# Patient Record
Sex: Male | Born: 1985 | Race: White | Hispanic: No | Marital: Single | Smoking: Current every day smoker
Health system: Southern US, Community
[De-identification: ages and names within clinical notes are randomized; demographics above are authoritative.]

## PROBLEM LIST (undated history)

## (undated) DIAGNOSIS — I10 Essential (primary) hypertension: Secondary | ICD-10-CM

---

## 2014-08-20 ENCOUNTER — Emergency Department (HOSPITAL_COMMUNITY): Payer: No Typology Code available for payment source

## 2014-08-20 ENCOUNTER — Emergency Department (HOSPITAL_COMMUNITY)
Admission: EM | Admit: 2014-08-20 | Discharge: 2014-08-21 | Disposition: A | Discharge status: Home / Self Care | Payer: No Typology Code available for payment source | Attending: Emergency Medicine | Admitting: Emergency Medicine

## 2014-08-20 ENCOUNTER — Encounter (HOSPITAL_COMMUNITY): Payer: Self-pay | Admitting: Emergency Medicine

## 2014-08-20 DIAGNOSIS — Y998 Other external cause status: Secondary | ICD-10-CM | POA: Insufficient documentation

## 2014-08-20 DIAGNOSIS — Y9389 Activity, other specified: Secondary | ICD-10-CM | POA: Diagnosis not present

## 2014-08-20 DIAGNOSIS — I10 Essential (primary) hypertension: Secondary | ICD-10-CM | POA: Insufficient documentation

## 2014-08-20 DIAGNOSIS — S301XXA Contusion of abdominal wall, initial encounter: Secondary | ICD-10-CM | POA: Diagnosis not present

## 2014-08-20 DIAGNOSIS — Z720 Tobacco use: Secondary | ICD-10-CM | POA: Diagnosis not present

## 2014-08-20 DIAGNOSIS — S3992XA Unspecified injury of lower back, initial encounter: Secondary | ICD-10-CM | POA: Diagnosis present

## 2014-08-20 DIAGNOSIS — S0012XA Contusion of left eyelid and periocular area, initial encounter: Secondary | ICD-10-CM | POA: Diagnosis not present

## 2014-08-20 DIAGNOSIS — Y9289 Other specified places as the place of occurrence of the external cause: Secondary | ICD-10-CM | POA: Diagnosis not present

## 2014-08-20 DIAGNOSIS — H05232 Hemorrhage of left orbit: Secondary | ICD-10-CM

## 2014-08-20 DIAGNOSIS — S4991XA Unspecified injury of right shoulder and upper arm, initial encounter: Secondary | ICD-10-CM | POA: Insufficient documentation

## 2014-08-20 HISTORY — DX: Essential (primary) hypertension: I10

## 2014-08-20 LAB — URINALYSIS, ROUTINE W REFLEX MICROSCOPIC
Bilirubin Urine: NEGATIVE
Glucose, UA: NEGATIVE mg/dL
Hgb urine dipstick: NEGATIVE
Ketones, ur: NEGATIVE mg/dL
LEUKOCYTES UA: NEGATIVE
Nitrite: NEGATIVE
PH: 5 (ref 5.0–8.0)
Protein, ur: NEGATIVE mg/dL
SPECIFIC GRAVITY, URINE: 1.045 — AB (ref 1.005–1.030)
Urobilinogen, UA: 0.2 mg/dL (ref 0.0–1.0)

## 2014-08-20 LAB — I-STAT CREATININE, ED: Creatinine, Ser: 1.2 mg/dL (ref 0.61–1.24)

## 2014-08-20 MED ORDER — OXYCODONE-ACETAMINOPHEN 5-325 MG PO TABS
2.0000 | ORAL_TABLET | Freq: Once | ORAL | Status: AC
  Administered 2014-08-20: 2 via ORAL
  Filled 2014-08-20: qty 2

## 2014-08-20 MED ORDER — SODIUM CHLORIDE 0.9 % IV BOLUS (SEPSIS)
1000.0000 mL | Freq: Once | INTRAVENOUS | Status: AC
  Administered 2014-08-20: 1000 mL via INTRAVENOUS

## 2014-08-20 MED ORDER — IBUPROFEN 800 MG PO TABS: 800.0000 mg | ORAL_TABLET | Freq: Three times a day (TID) | ORAL | Status: AC

## 2014-08-20 MED ORDER — TETANUS-DIPHTH-ACELL PERTUSSIS 5-2.5-18.5 LF-MCG/0.5 IM SUSP
0.5000 mL | Freq: Once | INTRAMUSCULAR | Status: AC
  Administered 2014-08-20: 0.5 mL via INTRAMUSCULAR
  Filled 2014-08-20: qty 0.5

## 2014-08-20 MED ORDER — MORPHINE SULFATE 4 MG/ML IJ SOLN
6.0000 mg | Freq: Once | INTRAMUSCULAR | Status: AC
Start: 2014-08-20 — End: 2014-08-20
  Administered 2014-08-20: 6 mg via INTRAVENOUS
  Filled 2014-08-20: qty 2

## 2014-08-20 MED ORDER — IOHEXOL 300 MG/ML  SOLN
100.0000 mL | Freq: Once | INTRAMUSCULAR | Status: AC | PRN
  Administered 2014-08-20: 100 mL via INTRAVENOUS

## 2014-08-20 MED ORDER — HYDROCODONE-ACETAMINOPHEN 5-325 MG PO TABS: 1.0000 | ORAL_TABLET | Freq: Four times a day (QID) | ORAL | Status: AC | PRN

## 2014-08-20 MED ORDER — ONDANSETRON HCL 4 MG/2ML IJ SOLN
4.0000 mg | Freq: Once | INTRAMUSCULAR | Status: AC
  Administered 2014-08-20: 4 mg via INTRAVENOUS
  Filled 2014-08-20: qty 2

## 2014-08-20 NOTE — ED Notes (Signed)
MD at bedside. 
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
## 2014-08-20 NOTE — ED Notes (Signed)
Pt leaving for CT.  

## 2014-08-20 NOTE — ED Notes (Signed)
Pt ambulatory in hallway with steady gait.
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
## 2015-07-12 ENCOUNTER — Encounter (HOSPITAL_COMMUNITY): Payer: Self-pay | Admitting: *Deleted

## 2015-07-12 ENCOUNTER — Emergency Department (HOSPITAL_COMMUNITY): Payer: No Typology Code available for payment source

## 2015-07-12 ENCOUNTER — Emergency Department (HOSPITAL_COMMUNITY)
Admission: EM | Admit: 2015-07-12 | Discharge: 2015-07-12 | Disposition: A | Discharge status: Home / Self Care | Payer: No Typology Code available for payment source | Attending: Emergency Medicine | Admitting: Emergency Medicine

## 2015-07-12 DIAGNOSIS — I10 Essential (primary) hypertension: Secondary | ICD-10-CM | POA: Insufficient documentation

## 2015-07-12 DIAGNOSIS — Y999 Unspecified external cause status: Secondary | ICD-10-CM | POA: Insufficient documentation

## 2015-07-12 DIAGNOSIS — Y92009 Unspecified place in unspecified non-institutional (private) residence as the place of occurrence of the external cause: Secondary | ICD-10-CM | POA: Insufficient documentation

## 2015-07-12 DIAGNOSIS — Y9389 Activity, other specified: Secondary | ICD-10-CM | POA: Insufficient documentation

## 2015-07-12 DIAGNOSIS — W294XXA Contact with nail gun, initial encounter: Secondary | ICD-10-CM | POA: Insufficient documentation

## 2015-07-12 DIAGNOSIS — Z79899 Other long term (current) drug therapy: Secondary | ICD-10-CM | POA: Insufficient documentation

## 2015-07-12 DIAGNOSIS — F17210 Nicotine dependence, cigarettes, uncomplicated: Secondary | ICD-10-CM | POA: Insufficient documentation

## 2015-07-12 DIAGNOSIS — S6992XA Unspecified injury of left wrist, hand and finger(s), initial encounter: Secondary | ICD-10-CM

## 2015-07-12 DIAGNOSIS — S61442A Puncture wound with foreign body of left hand, initial encounter: Secondary | ICD-10-CM | POA: Insufficient documentation

## 2015-07-12 MED ORDER — HYDROMORPHONE HCL 1 MG/ML IJ SOLN
1.0000 mg | Freq: Once | INTRAMUSCULAR | Status: AC
  Administered 2015-07-12: 1 mg via INTRAVENOUS
  Filled 2015-07-12: qty 1

## 2015-07-12 MED ORDER — LIDOCAINE HCL 2 % IJ SOLN
10.0000 mL | Freq: Once | INTRAMUSCULAR | Status: AC
  Administered 2015-07-12: 200 mg
  Filled 2015-07-12: qty 20

## 2015-07-12 MED ORDER — CEPHALEXIN 500 MG PO CAPS: 500.0000 mg | ORAL_CAPSULE | Freq: Four times a day (QID) | ORAL | Status: AC

## 2015-07-12 MED ORDER — ONDANSETRON HCL 4 MG/2ML IJ SOLN
4.0000 mg | Freq: Once | INTRAMUSCULAR | Status: AC
  Administered 2015-07-12: 4 mg via INTRAVENOUS
  Filled 2015-07-12: qty 2

## 2015-07-12 MED ORDER — OXYCODONE-ACETAMINOPHEN 5-325 MG PO TABS: 1.0000 | ORAL_TABLET | Freq: Four times a day (QID) | ORAL | Status: AC | PRN
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
## 2015-07-12 NOTE — Discharge Instructions (Signed)
Watch for signs of infection. Keep the wound clean continued antibiotics. Follow-up Dr. Melvyn Novasrtmann in 1 week

## 2015-07-12 NOTE — ED Provider Notes (Signed)
CSN: [id number]     Arrival date & time 07/12/15  1845 History   First MD Initiated Contact with Patient 07/12/15 1858     Chief Complaint  Patient presents with  . Puncture Wound      The history is provided by the patient.  Patient was working framing a house today when he is holding up a 2 x 4 this left hand. Began to fall and the nail then fired a framing nail into his left hand. No other injury. Slight numbness in the right thumb. Nail enters on the palmar aspect and is sticking out on the dorsal aspect of the hand. Tetanus is up-to-date. He is otherwise healthy but does smoke cigarettes.   Past Medical History  Diagnosis Date  . Hypertension    History reviewed. No pertinent past surgical history. No family history on file. Social History  Substance Use Topics  . Smoking status: Current Every Day Smoker -- 1.00 packs/day    Types: Cigarettes  . Smokeless tobacco: None  . Alcohol Use: Yes     Comment: occ    Review of Systems  Constitutional: Negative for fever and appetite change.  Respiratory: Negative for shortness of breath.   Cardiovascular: Negative for chest pain.  Musculoskeletal: Negative for myalgias and joint swelling.  Skin: Positive for wound.  Neurological: Positive for numbness.  Hematological: Negative for adenopathy. Does not bruise/bleed easily.      Allergies  Review of patient's allergies indicates no known allergies.  Home Medications   Prior to Admission medications   Medication Sig Start Date End Date Taking? Authorizing Provider  amLODipine (NORVASC) 10 MG tablet Take 10 mg by mouth daily.   Yes Historical Provider, MD  cyclobenzaprine (FLEXERIL) 5 MG tablet Take 5 mg by mouth at bedtime.   Yes Historical Provider, MD  cephALEXin (KEFLEX) 500 MG capsule Take 1 capsule (500 mg total) by mouth 4 (four) times daily. 07/12/15   Benjiman CoreNathan Deklan Minar, MD  HYDROcodone-acetaminophen (NORCO/VICODIN) 5-325 MG per tablet Take 1 tablet by mouth every 6  (six) hours as needed for moderate pain. Patient not taking: Reported on 07/12/2015 08/20/14   Fayrene HelperBowie Tran, PA-C  ibuprofen (ADVIL,MOTRIN) 800 MG tablet Take 1 tablet (800 mg total) by mouth 3 (three) times daily. Patient not taking: Reported on 07/12/2015 08/20/14   Fayrene HelperBowie Tran, PA-C  oxyCODONE-acetaminophen (PERCOCET/ROXICET) 5-325 MG tablet Take 1-2 tablets by mouth every 6 (six) hours as needed for severe pain. 07/12/15   Benjiman CoreNathan Sianni Cloninger, MD   BP 166/99 mmHg  Pulse 93  Temp(Src) 98.6 F (37 C) (Oral)  Resp 20  SpO2 100% Physical Exam  Constitutional: He appears well-developed.  HENT:  Head: Atraumatic.  Neck: Neck supple.  Cardiovascular: Normal rate.   Pulmonary/Chest: Effort normal.  Abdominal: There is no tenderness.  Musculoskeletal: He exhibits tenderness.  There is a nail in his left hand. It enters in the thenar area near the base of the first metacarpal. Goes dorsally to the back of the hand more distally. Sensation grossly intact over the thumb but states he does feel little tingly. Good capillary refill.  Skin: Skin is warm.        ED Course  .Foreign Body Removal Date/Time: 07/12/2015 8:00 PM Performed by: Benjiman CorePICKERING, Franceska Strahm Authorized by: Benjiman CorePICKERING, Toan Mort Consent: Verbal consent obtained. Written consent not obtained. Body area: skin General location: upper extremity Location details: left hand Anesthesia: local infiltration and nerve block Local anesthetic: lidocaine 2% without epinephrine Anesthetic total: 10 (10 ml at radial  and medial  nerve and locally infiltrated) ml Patient sedated: no Patient restrained: no Patient cooperative: yes Localization method: visualized Removal mechanism: vice grips. Dressing: dressing applied Tendon involvement: none Depth: deep Complexity: simple 1 objects recovered. Objects recovered: nail Post-procedure assessment: foreign body removed Patient tolerance: Patient tolerated the procedure well with no immediate  complications   (including critical care time) Labs Review Labs Reviewed - No data to display  Imaging Review Dg Hand Complete Left  07/12/2015  CLINICAL DATA:  Nail injury to the left hand EXAM: LEFT HAND - COMPLETE 3+ VIEW COMPARISON:  None. FINDINGS: Approximately 5 cm in length metallic nail traverses the thenar soft tissues between the proximal first and second metacarpals, with no fracture, dislocation or suspicious focal osseous lesion. No appreciable degenerative or erosive arthropathy. Separate punctate metallic foreign body in the volar left hand soft tissues at the level of the distal left third metacarpal. IMPRESSION: Metallic nail traverses the thenar soft tissues between the proximal first and second metacarpals, without appreciable bone injury. Separate punctate metallic foreign body in the volar left hand soft tissues at the level of the distal left third metacarpal. Electronically Signed   By: Delbert PhenixJason A Poff M.D.   On: 07/12/2015 19:56   I have personally reviewed and evaluated these images and lab results as part of my medical decision-making.   EKG Interpretation None      MDM   Final diagnoses:  Injury of left hand by nail gun, initial encounter    Patient with framing nail in hand. Removed. Will give antibiotics. Follow-up with hand surgery as needed.     Benjiman CoreNathan Lynford Espinoza, MD 07/13/15 [phone]
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
## 2017-03-18 IMAGING — CR DG HAND COMPLETE 3+V*L*
3 series · 3 of 3 positions shown · non-contrast
Comparison: None.

CLINICAL DATA: Nail injury to the left hand

EXAM:
LEFT HAND - COMPLETE 3+ VIEW

[x hand pa left]
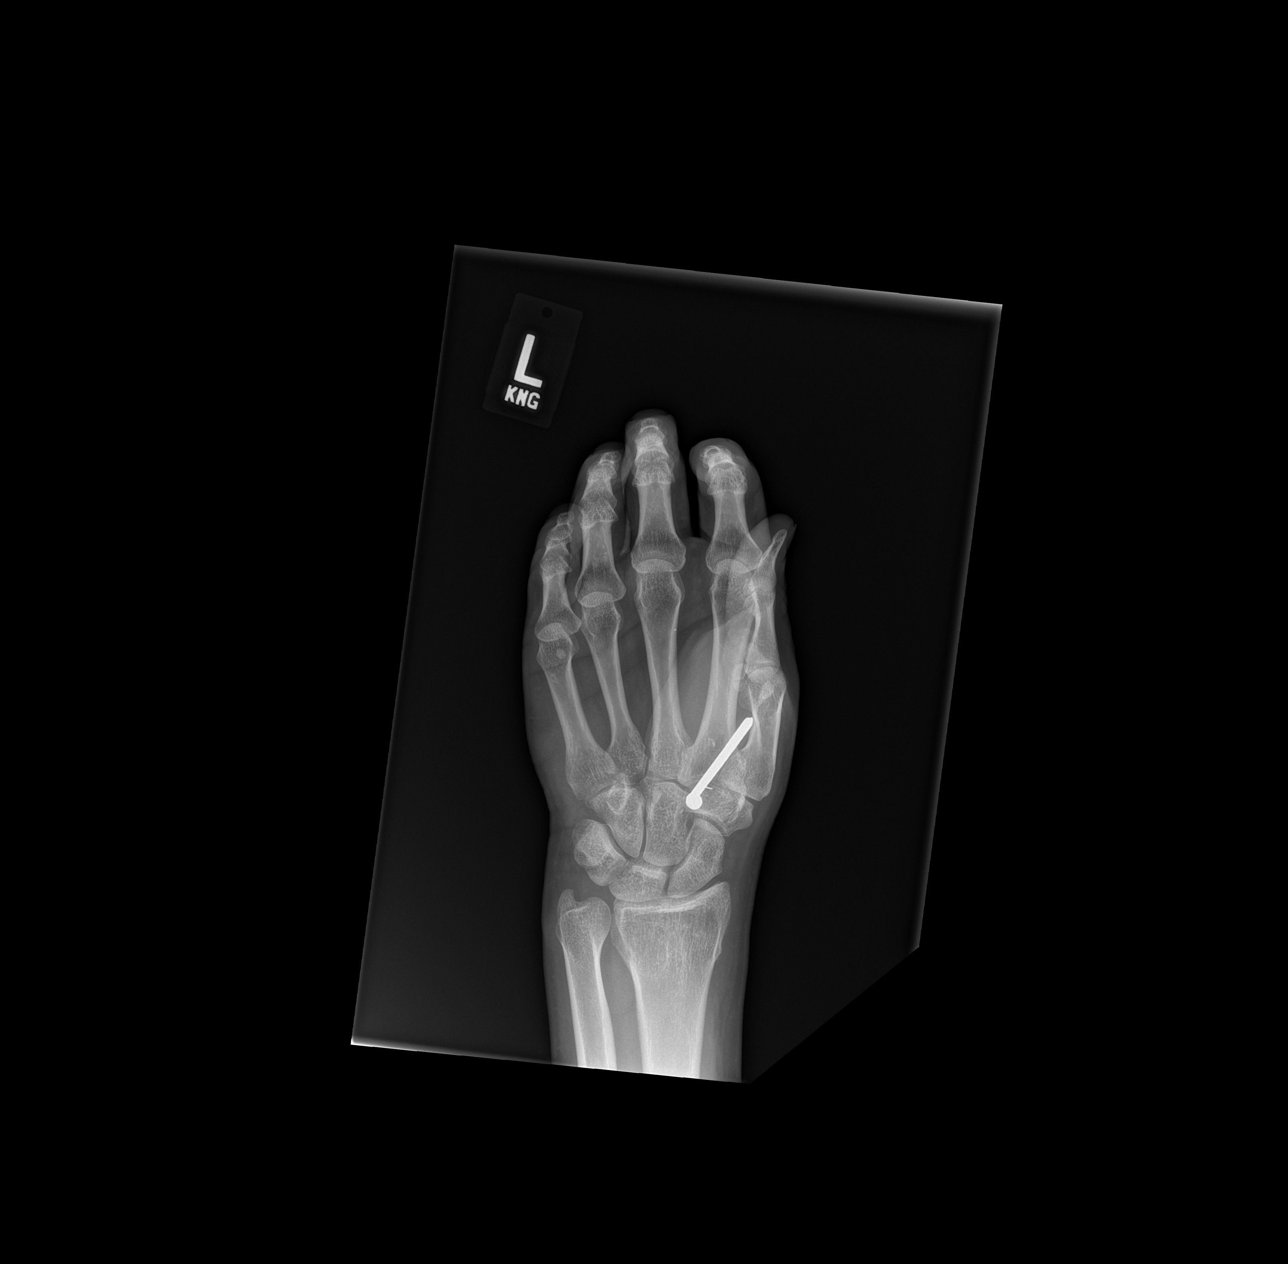

[x hand obl left]
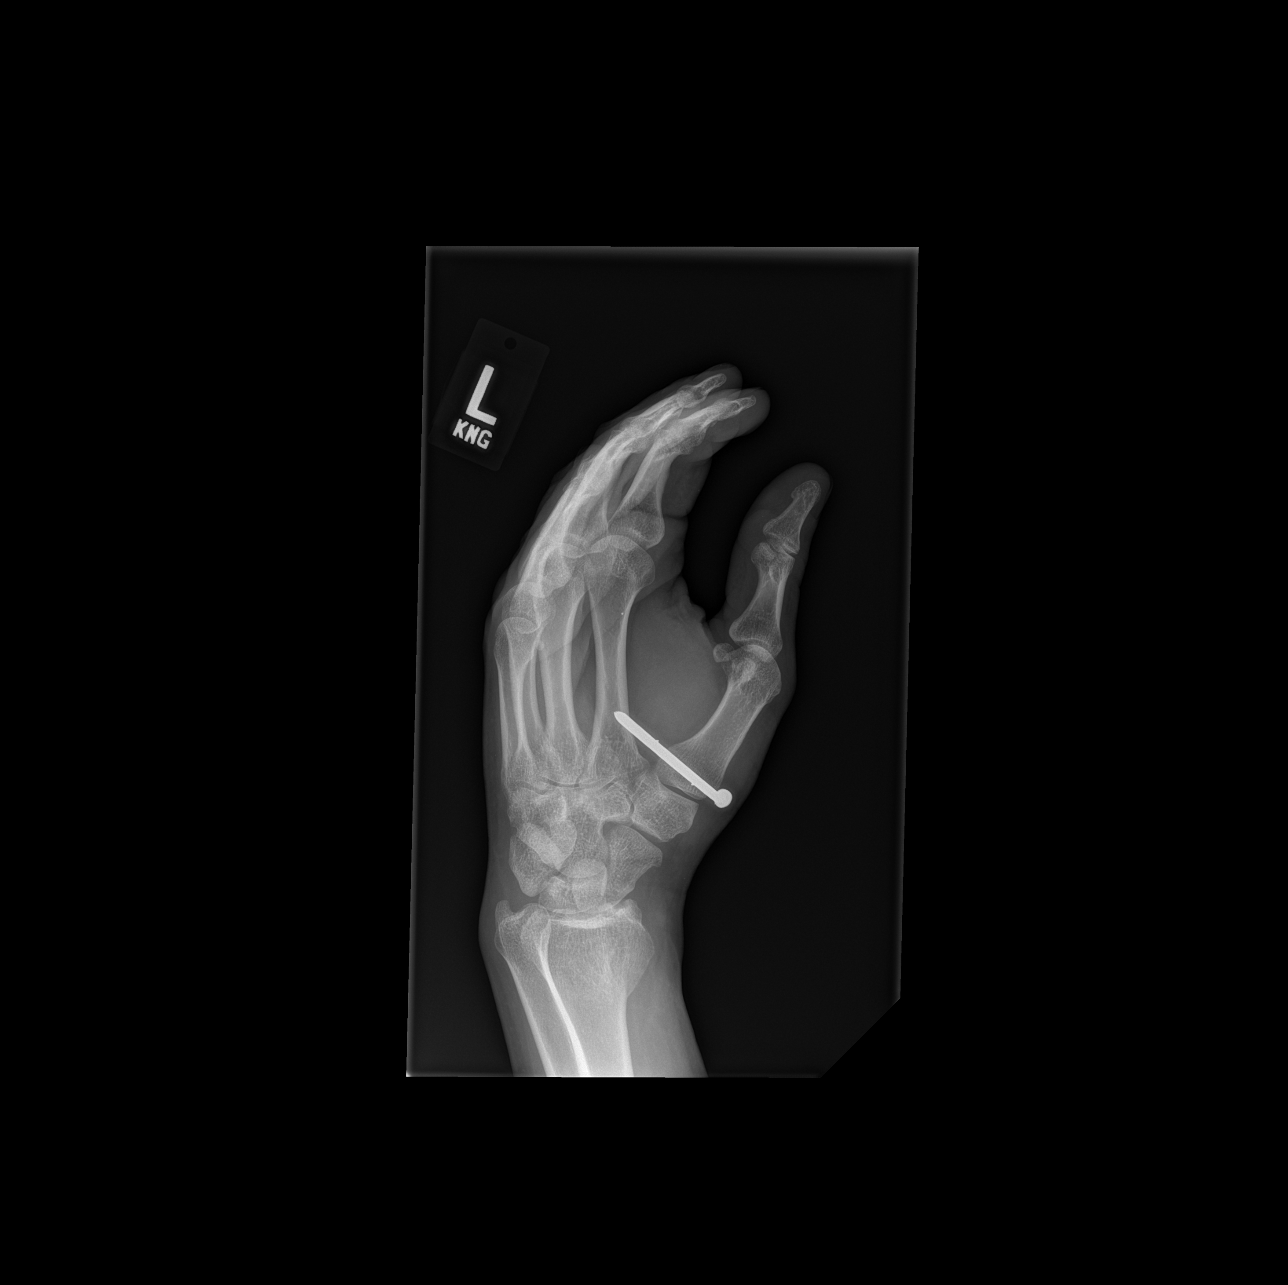

[x hand lat left]
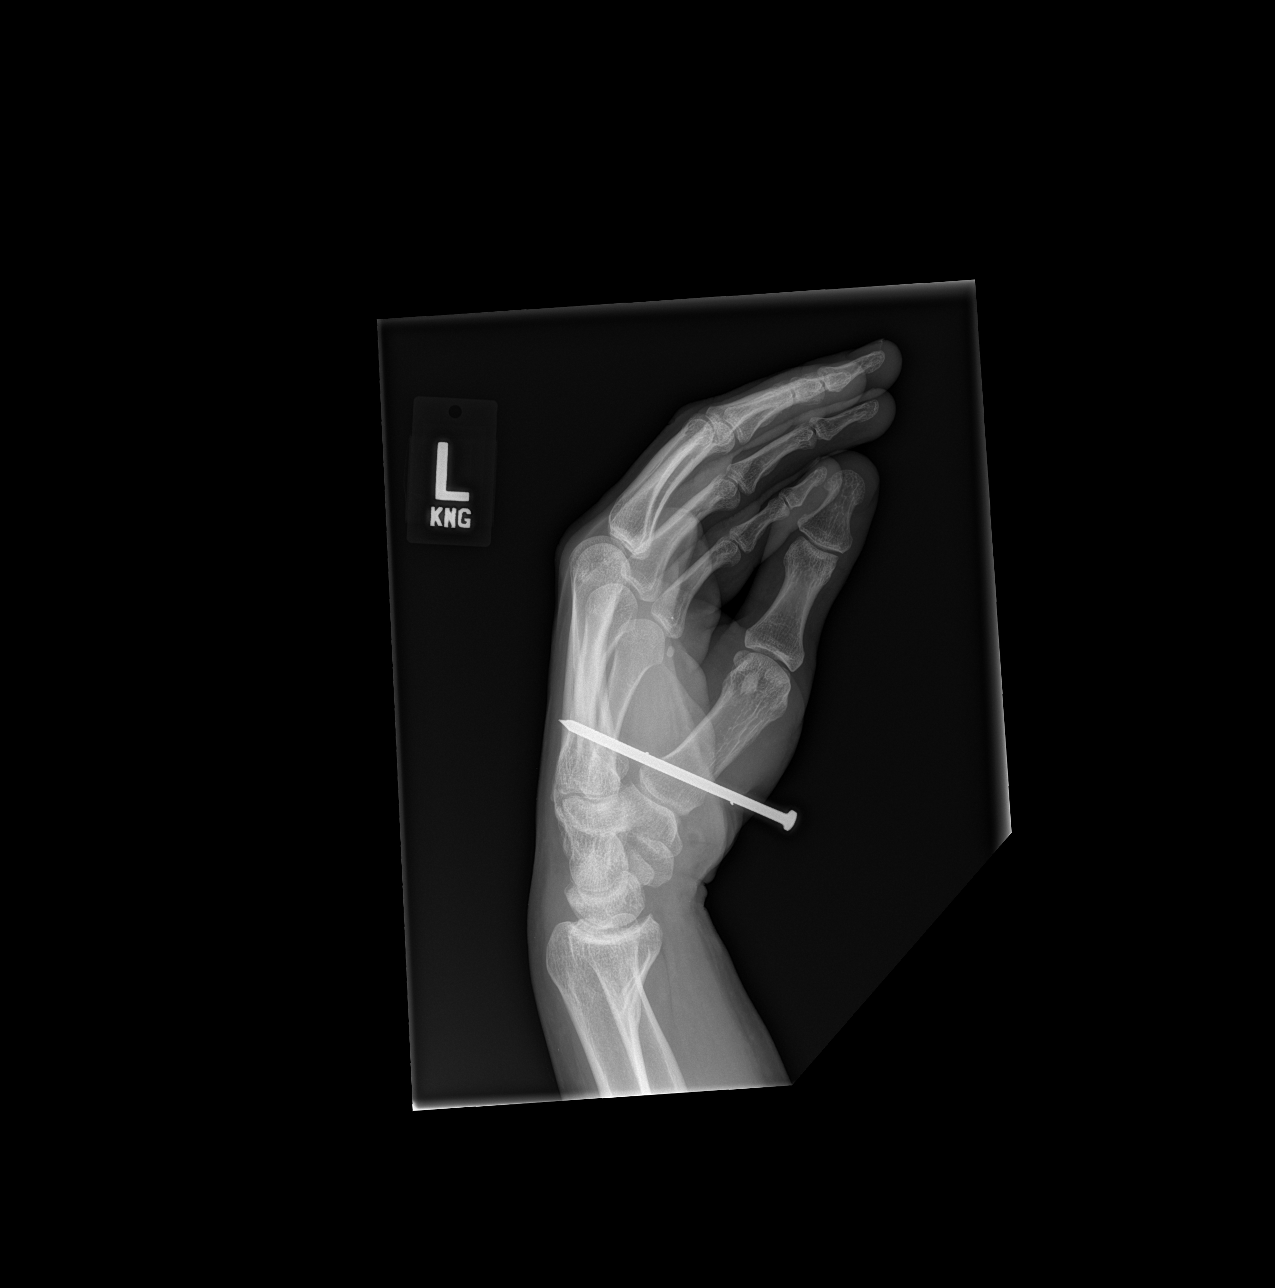

[3 of 3 positions shown; findings below may reference images not displayed]

FINDINGS: Approximately 5 cm in length metallic nail traverses the thenar soft
tissues between the proximal first and second metacarpals, with no
fracture, dislocation or suspicious focal osseous lesion. No
appreciable degenerative or erosive arthropathy. Separate punctate
metallic foreign body in the volar left hand soft tissues at the
level of the distal left third metacarpal.
IMPRESSION: Metallic nail traverses the thenar soft tissues between the proximal
first and second metacarpals, without appreciable bone injury.
Separate punctate metallic foreign body in the volar left hand soft
tissues at the level of the distal left third metacarpal.
